# Patient Record
Sex: Female | Born: 1935 | Race: White | Hispanic: No | Marital: Married | Smoking: Never smoker
Health system: Southern US, Community
[De-identification: ages and names within clinical notes are randomized; demographics above are authoritative.]

## PROBLEM LIST (undated history)

## (undated) DIAGNOSIS — E119 Type 2 diabetes mellitus without complications: Secondary | ICD-10-CM

## (undated) DIAGNOSIS — M1990 Unspecified osteoarthritis, unspecified site: Secondary | ICD-10-CM

---

## 2014-08-14 ENCOUNTER — Ambulatory Visit (HOSPITAL_COMMUNITY)
Admission: RE | Admit: 2014-08-14 | Discharge: 2014-08-14 | Disposition: A | Discharge status: Home / Self Care | Payer: Medicare Other | Source: Ambulatory Visit | Attending: General Surgery | Admitting: General Surgery

## 2014-08-14 ENCOUNTER — Encounter (HOSPITAL_BASED_OUTPATIENT_CLINIC_OR_DEPARTMENT_OTHER): Payer: Medicare Other | Attending: General Surgery

## 2014-08-14 ENCOUNTER — Other Ambulatory Visit (HOSPITAL_BASED_OUTPATIENT_CLINIC_OR_DEPARTMENT_OTHER): Payer: Self-pay | Admitting: General Surgery

## 2014-08-14 DIAGNOSIS — E119 Type 2 diabetes mellitus without complications: Secondary | ICD-10-CM | POA: Diagnosis not present

## 2014-08-14 DIAGNOSIS — L97509 Non-pressure chronic ulcer of other part of unspecified foot with unspecified severity: Secondary | ICD-10-CM | POA: Insufficient documentation

## 2014-08-14 DIAGNOSIS — E1169 Type 2 diabetes mellitus with other specified complication: Secondary | ICD-10-CM | POA: Diagnosis not present

## 2014-08-14 DIAGNOSIS — M869 Osteomyelitis, unspecified: Secondary | ICD-10-CM

## 2014-08-15 NOTE — H&P (Signed)
[name] [name], [name] NO.:  [id number]  MEDICAL RECORD NO.:  [id number]  LOCATION:                                 FACILITY:  PHYSICIAN:  Joanne Gavel, M.D.        DATE OF BIRTH:  December 16, 1934  DATE OF ADMISSION:  08/14/2014 DATE OF DISCHARGE:  08/14/2014                             HISTORY & PHYSICAL   CHIEF COMPLAINT:  Wound, left great toe.  HISTORY OF PRESENT ILLNESS:  This 79 year old female, diabetic for many years.  Wheelchair bound because of back problems, developed a sore on the tip of her left great toe approximately 2 weeks ago.  This has not been particularly treated.  PAST MEDICAL HISTORY:  Significant for many conditions including anemia, COPD, sleep apnea, angina, congestive heart failure, diabetes, osteoarthritis, and neuropathy.  PAST SURGICAL HISTORY:  Essentially negative.  SOCIAL HISTORY:  Cigarettes, none.  Alcohol, none.  MEDICATIONS:  Lyrica, Nitro-Dur, NovoLog, Lipitor, trazodone, Zoloft, Zebeta, losartan, glipizide, iron, B12 shots, cholecalciferol, oxycodone, DuoNeb, Xanax, K-Dur, Lasix, Levemir.  ALLERGIES:  Penicillin and latex.  REVIEW OF SYSTEMS:  Essentially as above.  PHYSICAL EXAMINATION:  VITAL SIGNS:  Temperature 98.3, pulse 72 and regular, respirations 18, blood pressure 117/54.  Glucose is 166. GENERAL APPEARANCE:  Well developed, pale, somewhat obese, no distress. CHEST:  Clear. HEART:  Regular rhythm. ABDOMEN:  Not examined. EXTREMITIES:  Examination of lower extremity reveals an ABI of 1.28. Dorsalis pedis pulse is 4/4.  At the tip of the great toe, there is a 1.9 x 2.7 blister which when over excised reveals a very tiny skin wound at the tip of the great toe.  Loss of sensation is profound.  IMPRESSION:  Diabetic foot ulcer, Wagner 2.  PLAN OF TREATMENT:  We will treat with silver alginate and a wrap, changed 3 times a week.  We will see her in 7 days.     Joanne Gavel, M.D.     RA/MEDQ  D:  08/14/2014   T:  08/14/2014  Job:  [id number]

## 2014-08-21 DIAGNOSIS — L97509 Non-pressure chronic ulcer of other part of unspecified foot with unspecified severity: Secondary | ICD-10-CM | POA: Diagnosis not present

## 2014-08-21 DIAGNOSIS — E1169 Type 2 diabetes mellitus with other specified complication: Secondary | ICD-10-CM | POA: Diagnosis not present

## 2014-08-28 DIAGNOSIS — E1169 Type 2 diabetes mellitus with other specified complication: Secondary | ICD-10-CM | POA: Diagnosis not present

## 2014-08-28 DIAGNOSIS — L97509 Non-pressure chronic ulcer of other part of unspecified foot with unspecified severity: Secondary | ICD-10-CM | POA: Diagnosis not present

## 2014-08-28 LAB — GLUCOSE, CAPILLARY: GLUCOSE-CAPILLARY: 226 mg/dL — AB (ref 70–99)

## 2014-09-22 ENCOUNTER — Emergency Department (HOSPITAL_COMMUNITY)
Admission: EM | Admit: 2014-09-22 | Discharge: 2014-09-23 | Disposition: A | Discharge status: Home / Self Care | Payer: Medicare Other | Attending: Emergency Medicine | Admitting: Emergency Medicine

## 2014-09-22 DIAGNOSIS — Z91040 Latex allergy status: Secondary | ICD-10-CM | POA: Diagnosis not present

## 2014-09-22 DIAGNOSIS — R0602 Shortness of breath: Secondary | ICD-10-CM | POA: Diagnosis not present

## 2014-09-22 DIAGNOSIS — R4182 Altered mental status, unspecified: Secondary | ICD-10-CM | POA: Diagnosis present

## 2014-09-22 DIAGNOSIS — Z880 Allergy status to penicillin: Secondary | ICD-10-CM | POA: Insufficient documentation

## 2014-09-22 DIAGNOSIS — R5383 Other fatigue: Secondary | ICD-10-CM | POA: Insufficient documentation

## 2014-09-22 DIAGNOSIS — M6281 Muscle weakness (generalized): Secondary | ICD-10-CM | POA: Diagnosis not present

## 2014-09-22 DIAGNOSIS — R531 Weakness: Secondary | ICD-10-CM

## 2014-09-22 DIAGNOSIS — Z7982 Long term (current) use of aspirin: Secondary | ICD-10-CM | POA: Diagnosis not present

## 2014-09-22 DIAGNOSIS — Z79899 Other long term (current) drug therapy: Secondary | ICD-10-CM | POA: Insufficient documentation

## 2014-09-22 DIAGNOSIS — R413 Other amnesia: Secondary | ICD-10-CM | POA: Insufficient documentation

## 2014-09-22 HISTORY — DX: Unspecified osteoarthritis, unspecified site: M19.90

## 2014-09-22 HISTORY — DX: Type 2 diabetes mellitus without complications: E11.9

## 2014-09-22 LAB — CBC WITH DIFFERENTIAL/PLATELET
Basophils Absolute: 0 10*3/uL (ref 0.0–0.1)
Basophils Relative: 0 % (ref 0–1)
Eosinophils Absolute: 0.1 10*3/uL (ref 0.0–0.7)
Eosinophils Relative: 1 % (ref 0–5)
HCT: 32.4 % — ABNORMAL LOW (ref 36.0–46.0)
Hemoglobin: 11.4 g/dL — ABNORMAL LOW (ref 12.0–15.0)
LYMPHS ABS: 0.8 10*3/uL (ref 0.7–4.0)
Lymphocytes Relative: 6 % — ABNORMAL LOW (ref 12–46)
MCH: 31.1 pg (ref 26.0–34.0)
MCHC: 35.2 g/dL (ref 30.0–36.0)
MCV: 88.3 fL (ref 78.0–100.0)
Monocytes Absolute: 0.8 10*3/uL (ref 0.1–1.0)
Monocytes Relative: 6 % (ref 3–12)
NEUTROS PCT: 87 % — AB (ref 43–77)
Neutro Abs: 11.9 10*3/uL — ABNORMAL HIGH (ref 1.7–7.7)
PLATELETS: 227 10*3/uL (ref 150–400)
RBC: 3.67 MIL/uL — AB (ref 3.87–5.11)
RDW: 14.6 % (ref 11.5–15.5)
WBC: 13.6 10*3/uL — ABNORMAL HIGH (ref 4.0–10.5)

## 2014-09-22 NOTE — ED Provider Notes (Signed)
CSN: [id number]     Arrival date & time 09/22/14  2312 History   First MD Initiated Contact with Patient 09/22/14 2318     Chief Complaint  Patient presents with  . Altered Mental Status     (Consider location/radiation/quality/duration/timing/severity/associated sxs/prior Treatment) HPI Comments: 79 year old female with COPD, CHF, home oxygen 3 L, nursing home, obesity presents with mild mental status changes throughout the day and general fatigue. Patient uses wheelchair at baseline however was able to stand with assistance and today unable to stand with assistance due to general weakness and fatigue. Patient denies any other focal deficits are other concerns. Patient says she is feels generally weak. Difficult history and exam as patient is fatigued parents and will answer questions randomly.  Patient is a 79 y.o. female presenting with altered mental status. The history is provided by the patient, the nursing home and the EMS personnel.  Altered Mental Status Associated symptoms: weakness (gen)   Associated symptoms: no abdominal pain, no fever, no headaches, no light-headedness, no rash and no vomiting     No past medical history on file. No past surgical history on file. No family history on file. History  Substance Use Topics  . Smoking status: Not on file  . Smokeless tobacco: Not on file  . Alcohol Use: Not on file   OB History   No data available     Review of Systems  Constitutional: Positive for fatigue. Negative for fever and chills.  HENT: Negative for congestion.   Eyes: Negative for visual disturbance.  Respiratory: Positive for shortness of breath (chronic).   Cardiovascular: Negative for chest pain.  Gastrointestinal: Negative for vomiting and abdominal pain.  Genitourinary: Negative for dysuria and flank pain.  Musculoskeletal: Negative for back pain, neck pain and neck stiffness.  Skin: Negative for rash.  Neurological: Positive for weakness (gen).  Negative for light-headedness and headaches.      Allergies  Latex and Penicillins  Home Medications   Prior to Admission medications   Medication Sig Start Date End Date Taking? Authorizing Provider  acetaminophen (TYLENOL) 325 MG tablet Take 650 mg by mouth 2 (two) times daily.   Yes Historical Provider, MD  ALPRAZolam (XANAX) 0.25 MG tablet Take 0.25 mg by mouth every 8 (eight) hours as needed for anxiety.   Yes Historical Provider, MD  aspirin EC 81 MG tablet Take 81 mg by mouth daily.   Yes Historical Provider, MD  atorvastatin (LIPITOR) 10 MG tablet Take 10 mg by mouth daily.   Yes Historical Provider, MD  bisoprolol (ZEBETA) 10 MG tablet Take 10 mg by mouth daily.   Yes Historical Provider, MD  furosemide (LASIX) 80 MG tablet Take 80 mg by mouth daily.   Yes Historical Provider, MD  glipiZIDE (GLUCOTROL XL) 10 MG 24 hr tablet Take 10 mg by mouth daily with breakfast.   Yes Historical Provider, MD  insulin detemir (LEVEMIR) 100 UNIT/ML injection Inject 14-24 Units into the skin 2 (two) times daily. Use 14 units every morning and use 24 units at bedtime   Yes Historical Provider, MD  ipratropium-albuterol (DUONEB) 0.5-2.5 (3) MG/3ML SOLN Take 3 mLs by nebulization every 6 (six) hours as needed (for breathing).   Yes Historical Provider, MD  iron polysaccharides (NIFEREX) 150 MG capsule Take 150 mg by mouth daily.   Yes Historical Provider, MD  losartan (COZAAR) 25 MG tablet Take 25 mg by mouth daily.   Yes Historical Provider, MD  Menthol-Camphor (TIGER BALM ARTHRITIS RUB) 11-11 %  CREA Apply topically 3 (three) times daily as needed (for pain).   Yes Historical Provider, MD  morphine (MS CONTIN) 15 MG 12 hr tablet Take 15 mg by mouth every 12 (twelve) hours.   Yes Historical Provider, MD  morphine (MS CONTIN) 30 MG 12 hr tablet Take 30 mg by mouth See admin instructions. Daily at 2 pm   Yes Historical Provider, MD  Multiple Vitamin (MULTIVITAMIN WITH MINERALS) TABS tablet Take 1 tablet  by mouth daily.   Yes Historical Provider, MD  nitroGLYCERIN (NITRODUR - DOSED IN MG/24 HR) 0.1 mg/hr patch Place 0.1 mg onto the skin daily.   Yes Historical Provider, MD  nystatin-triamcinolone (MYCOLOG II) cream Apply 1 application topically 2 (two) times daily as needed (for rash).   Yes Historical Provider, MD  oxyCODONE (OXY IR/ROXICODONE) 5 MG immediate release tablet Take 5 mg by mouth every 4 (four) hours as needed for severe pain.   Yes Historical Provider, MD  potassium chloride SA (K-DUR,KLOR-CON) 20 MEQ tablet Take 20 mEq by mouth 2 (two) times daily.   Yes Historical Provider, MD  pregabalin (LYRICA) 300 MG capsule Take 300 mg by mouth 2 (two) times daily.   Yes Historical Provider, MD  senna-docusate (SENOKOT-S) 8.6-50 MG per tablet Take 1 tablet by mouth daily.   Yes Historical Provider, MD  sertraline (ZOLOFT) 100 MG tablet Take 150 mg by mouth at bedtime.   Yes Historical Provider, MD  traZODone (DESYREL) 50 MG tablet Take 25 mg by mouth at bedtime.   Yes Historical Provider, MD  vitamin B-12 (CYANOCOBALAMIN) 500 MCG tablet Take 500 mcg by mouth daily.   Yes Historical Provider, MD  Vitamin D, Ergocalciferol, (DRISDOL) 50000 UNITS CAPS capsule Take 50,000 Units by mouth every 30 (thirty) days.   Yes Historical Provider, MD   BP 113/59  Pulse 74  Resp 17  Ht 5\' 5"  (1.651 m)  Wt 280 lb (127.007 kg)  BMI 46.59 kg/m2  SpO2 96% Physical Exam  Nursing note and vitals reviewed. Constitutional: She appears well-developed and well-nourished.  HENT:  Head: Normocephalic and atraumatic.  Eyes: Conjunctivae are normal. Right eye exhibits no discharge. Left eye exhibits no discharge.  Neck: Normal range of motion. Neck supple. No tracheal deviation present.  Cardiovascular: Normal rate and regular rhythm.   Pulmonary/Chest: Effort normal and breath sounds normal.  Abdominal: Soft. She exhibits no distension. There is no tenderness (obesity). There is no guarding.  Musculoskeletal:  She exhibits no edema.  Neurological: She is alert. GCS eye subscore is 3. GCS verbal subscore is 4. GCS motor subscore is 6.  General fatigue appearance mild lethargy. With loud verbal patient does answer most questions. Patient has 4+ weakness bilateral upper and lower extremity, gross sensation intact bilateral. Pupils equal, extra on the muscle function intact, neck supple no meningismus, general slowing response.  Skin: Skin is warm. No rash noted.  Psychiatric: She has a normal mood and affect.    ED Course  Procedures (including critical care time) Labs Review Labs Reviewed  BASIC METABOLIC PANEL - Abnormal; Notable for the following:    Sodium 134 (*)    Chloride 95 (*)    Glucose, Bld 258 (*)    BUN 40 (*)    Creatinine, Ser 1.79 (*)    GFR calc non Af Amer 26 (*)    GFR calc Af Amer 30 (*)    Anion gap 16 (*)    All other components within normal limits  CBC WITH DIFFERENTIAL -  Abnormal; Notable for the following:    WBC 13.6 (*)    RBC 3.67 (*)    Hemoglobin 11.4 (*)    HCT 32.4 (*)    Neutrophils Relative % 87 (*)    Neutro Abs 11.9 (*)    Lymphocytes Relative 6 (*)    All other components within normal limits  I-STAT ARTERIAL BLOOD GAS, ED - Abnormal; Notable for the following:    pO2, Arterial 103.0 (*)    Bicarbonate 25.0 (*)    All other components within normal limits  URINE CULTURE  TROPONIN I  URINALYSIS, ROUTINE W REFLEX MICROSCOPIC  PRO B NATRIURETIC PEPTIDE  BLOOD GAS, ARTERIAL    Imaging Review Dg Chest 2 View  09/23/2014   CLINICAL DATA:  Shortness of breath. History of COPD, diabetes, hypertension.  EXAM: CHEST  2 VIEW  COMPARISON:  05/02/2013  FINDINGS: Cardiomegaly. No confluent opacities or effusions. Mild vascular congestion. No edema. Trace effusions noted posteriorly on the lateral view.  IMPRESSION: Cardiomegaly with vascular congestion and trace effusions.   Electronically Signed   By: Charlett NoseKevin  Dover M.D.   On: 09/23/2014 01:03   Ct Head Wo  Contrast  09/23/2014   CLINICAL DATA:  Initial evaluation for altered mental status. Acute memory changes.  EXAM: CT HEAD WITHOUT CONTRAST  TECHNIQUE: Contiguous axial images were obtained from the base of the skull through the vertex without intravenous contrast.  COMPARISON:  Prior study from 05/01/2009  FINDINGS: Diffuse prominence of the CSF containing spaces is compatible with generalized cerebral atrophy. Patchy hypodensity within the periventricular and deep white matter most consistent with chronic small vessel ischemic changes.  There is no acute intracranial hemorrhage or infarct. No mass lesion or midline shift. Gray-white matter differentiation is well maintained. Ventricles are normal in size without evidence of hydrocephalus. No extra-axial fluid collection.  The calvarium is intact.  Orbital soft tissues are within normal limits.  The paranasal sinuses are clear. Scant opacity present within the inferior mastoid air cells bilaterally.  Scalp soft tissues are unremarkable.  IMPRESSION: 1. No acute intracranial process. 2. Generalized cerebral atrophy with moderate chronic microvascular ischemic disease.   Electronically Signed   By: Rise MuBenjamin  McClintock M.D.   On: 09/23/2014 01:26     EKG Interpretation None     EKG reviewed heart rate 88, sinus, nonspecific T-wave flattening, normal QT  MDM   Final diagnoses:  SOB (shortness of breath)  Memory change  General weakness  ARF  Patient with mild memory changes throughout the day and general weakness and fatigue. Broad differential with patient's medical history. Plan for metabolic, cardiac, ABG to check CO2 levels, urinalysis and chest x-ray. CT head. No witnessed falls. CT head and cxr no acute findings, reviewed.  Pt improved significantly in ED, on recheck at baseline sitting up talking, smiling, family agrees she is at baseline. Pt prefers outpt fup, no acute findings in work up, pt comfortable with outpt fup. No indication for  admission at this time. Results and differential diagnosis were discussed with the patient/parent/guardian. Close follow up outpatient was discussed, comfortable with the plan.   Medications - No data to display  Filed Vitals:   09/23/14 0200 09/23/14 0230 09/23/14 0300 09/23/14 0351  BP: 102/50 124/74 122/52 113/59  Pulse: 72 75 72 74  Resp: 17 16 14 17   Height:      Weight:      SpO2: 96% 97% 96% 96%    Final diagnoses:  SOB (shortness of breath)  Memory change  General weakness  ARF    Enid SkeensJoshua M Tawania Daponte, MD 09/23/14 [id number]

## 2014-09-22 NOTE — ED Notes (Signed)
From [facility], altered per staff, pt denies pain or complaints, acute memory changes per staff, CBG 277, VSS, 84% on RA, 94% on 4L, no focal neuro deficits, unable to stand without assistance,

## 2014-09-23 ENCOUNTER — Emergency Department (HOSPITAL_COMMUNITY): Payer: Medicare Other

## 2014-09-23 ENCOUNTER — Encounter (HOSPITAL_COMMUNITY): Payer: Self-pay | Admitting: Emergency Medicine

## 2014-09-23 DIAGNOSIS — R0602 Shortness of breath: Secondary | ICD-10-CM | POA: Diagnosis not present

## 2014-09-23 LAB — PRO B NATRIURETIC PEPTIDE: PRO B NATRI PEPTIDE: 375.1 pg/mL (ref 0–450)

## 2014-09-23 LAB — URINALYSIS, ROUTINE W REFLEX MICROSCOPIC
Bilirubin Urine: NEGATIVE
Glucose, UA: NEGATIVE mg/dL
Hgb urine dipstick: NEGATIVE
Ketones, ur: NEGATIVE mg/dL
LEUKOCYTES UA: NEGATIVE
Nitrite: NEGATIVE
Protein, ur: NEGATIVE mg/dL
SPECIFIC GRAVITY, URINE: 1.012 (ref 1.005–1.030)
UROBILINOGEN UA: 0.2 mg/dL (ref 0.0–1.0)
pH: 5 (ref 5.0–8.0)

## 2014-09-23 LAB — BASIC METABOLIC PANEL
ANION GAP: 16 — AB (ref 5–15)
BUN: 40 mg/dL — ABNORMAL HIGH (ref 6–23)
CO2: 23 mEq/L (ref 19–32)
Calcium: 9.5 mg/dL (ref 8.4–10.5)
Chloride: 95 mEq/L — ABNORMAL LOW (ref 96–112)
Creatinine, Ser: 1.79 mg/dL — ABNORMAL HIGH (ref 0.50–1.10)
GFR calc non Af Amer: 26 mL/min — ABNORMAL LOW (ref >90)
GFR, EST AFRICAN AMERICAN: 30 mL/min — AB (ref >90)
Glucose, Bld: 258 mg/dL — ABNORMAL HIGH (ref 70–99)
POTASSIUM: 5.1 meq/L (ref 3.7–5.3)
SODIUM: 134 meq/L — AB (ref 137–147)

## 2014-09-23 LAB — I-STAT ARTERIAL BLOOD GAS, ED
ACID-BASE EXCESS: 1 mmol/L (ref 0.0–2.0)
BICARBONATE: 25 meq/L — AB (ref 20.0–24.0)
O2 Saturation: 98 %
PCO2 ART: 38 mmHg (ref 35.0–45.0)
Patient temperature: 98.6
TCO2: 26 mmol/L (ref 0–100)
pH, Arterial: 7.427 (ref 7.350–7.450)
pO2, Arterial: 103 mmHg — ABNORMAL HIGH (ref 80.0–100.0)

## 2014-09-23 LAB — TROPONIN I: Troponin I: <0.3 ng/mL (ref <0.30)

## 2014-09-23 NOTE — ED Notes (Signed)
Patient transported to X-ray 

## 2014-09-23 NOTE — Discharge Instructions (Signed)
If you were given medicines take as directed.  If you are on coumadin or contraceptives realize their levels and effectiveness is altered by many different medicines.  If you have any reaction (rash, tongues swelling, other) to the medicines stop taking and see a physician.   Please follow up as directed and return to the ER or see a physician for new or worsening symptoms.  Thank you. Filed Vitals:   09/23/14 0200 09/23/14 0230 09/23/14 0300 09/23/14 0351  BP: 102/50 124/74 122/52 113/59  Pulse: 72 75 72 74  Resp: 17 16 14 17   Height:      Weight:      SpO2: 96% 97% 96% 96%

## 2014-09-23 NOTE — ED Notes (Signed)
[facility] contacted to tx patient back to [facility]

## 2014-09-23 NOTE — ED Notes (Signed)
Patient displaced IV from left forearm. Bleeding controlled with direct pressure and has stopped.

## 2014-09-24 LAB — URINE CULTURE
CULTURE: NO GROWTH
Colony Count: NO GROWTH

## 2014-12-10 ENCOUNTER — Encounter (HOSPITAL_BASED_OUTPATIENT_CLINIC_OR_DEPARTMENT_OTHER): Payer: Medicare Other | Attending: Plastic Surgery

## 2014-12-10 DIAGNOSIS — G4733 Obstructive sleep apnea (adult) (pediatric): Secondary | ICD-10-CM | POA: Diagnosis not present

## 2014-12-10 DIAGNOSIS — E785 Hyperlipidemia, unspecified: Secondary | ICD-10-CM | POA: Diagnosis not present

## 2014-12-10 DIAGNOSIS — L97429 Non-pressure chronic ulcer of left heel and midfoot with unspecified severity: Secondary | ICD-10-CM | POA: Insufficient documentation

## 2014-12-10 DIAGNOSIS — Z7951 Long term (current) use of inhaled steroids: Secondary | ICD-10-CM | POA: Diagnosis not present

## 2014-12-10 DIAGNOSIS — E11621 Type 2 diabetes mellitus with foot ulcer: Secondary | ICD-10-CM | POA: Diagnosis present

## 2014-12-10 DIAGNOSIS — Z7982 Long term (current) use of aspirin: Secondary | ICD-10-CM | POA: Diagnosis not present

## 2014-12-10 DIAGNOSIS — E669 Obesity, unspecified: Secondary | ICD-10-CM | POA: Diagnosis not present

## 2014-12-10 DIAGNOSIS — Z79891 Long term (current) use of opiate analgesic: Secondary | ICD-10-CM | POA: Diagnosis not present

## 2014-12-10 DIAGNOSIS — I509 Heart failure, unspecified: Secondary | ICD-10-CM | POA: Diagnosis not present

## 2014-12-10 DIAGNOSIS — G629 Polyneuropathy, unspecified: Secondary | ICD-10-CM | POA: Diagnosis not present

## 2014-12-10 DIAGNOSIS — J449 Chronic obstructive pulmonary disease, unspecified: Secondary | ICD-10-CM | POA: Insufficient documentation

## 2014-12-10 DIAGNOSIS — I10 Essential (primary) hypertension: Secondary | ICD-10-CM | POA: Insufficient documentation

## 2014-12-11 NOTE — Consult Note (Signed)
NAMJene Every:  [name], [name]                ACCOUNT NO.:  [id number]  MEDICAL RECORD NO.:  [id number]  LOCATION:  FOOT                         FACILITY:  [facility]  PHYSICIAN:  Glenna FellowsBrinda Sharri Loya, MD   DATE OF BIRTH:  11-22-1935  DATE OF CONSULTATION:  12/10/2014 DATE OF DISCHARGE:                                CONSULTATION   CHIEF COMPLAINT:  Left heel ulceration.  HISTORY OF PRESENT ILLNESS:  The patient is a 80 year old, minimally ambulatory female with diabetes mellitus who presents for waxing and waning history for the last 3 to 4 months of left heel ulceration. Patient is largely wheelchair bound secondary to back problems.  She states that the ulceration over her heel comes and goes and is associated with her footwear.  She has what appears to be soft, offloading boots in her nursing facility.  She states they are quite old by several years.  [facility] the fall of 2015 for left great toe ulceration that went on to heal.  Review of her workup at that time revealed plain films.  No vascular workup was completed.  Patient states that her blood sugars are anywhere from the 100s to 200s.  She does not recall a recent hemoglobin A1c.  There are no recent laboratories available for review.  PAST MEDICAL HISTORY:  Includes diabetes mellitus, obesity, arthritis, hypertension, and hyperlipidemia. COPD, obstructive sleep apnea, angina, congestive heart failure, osteoarthritis, and peripheral neuropathy.  ALLERGIES:  Include LATEX and PENICILLIN.  MEDICATIONS:  Include Xanax with Lipitor, aspirin, vitamin B12, Lasix, Glucotrol, Levemir, DuoNeb, Cozaar, oral morphine, Nitro-Dur, oxycodone, Niferex, Mycolog cream, Zebeta, Lyrica, Senokot, Zoloft, Desyrel, and potassium chloride.  PAST SURGICAL HISTORY:  Includes cataracts and left ankle surgery and pinning.  SOCIAL HISTORY:  The patient is a nonsmoker.  PHYSICAL EXAMINATION:  VITAL SIGNS:  Blood pressure is  106/49, blood glucose is 200, pulse is 66, temperature is 97.9, height is 5 feet 3-1/2 inches, weight is 260 pounds. She has absent sensation over all of the toes and metatarsals over her left foot.  She does have positive sensation over heel midfoot in dorsum of her toe.  She has a palpable dorsalis pedis.  Posterior tibialis is not palpable.  ABI is calculated as 1.28.  Examination of left heel reveals a Wagner 2 ulceration measured as a cluster at 1.8 x 4 x 0.1 cm.  Left calf circumference is 45 cm, left ankle is 27.1 cm.  No debridement is performed today.  We will institute collagen dressings with heel foam protection.  Order was also placed for the nursing facility to provide a Podus boot while sleeping.  New x-rays of her left foot were ordered as well as referral to the vascular and vein specialist for ABI and TBI screening.  There are no recent laboratories to review and laboratories including hemoglobin A1c, prealbumin, CBC, and BMP were ordered.  We will plan for followup in 2 weeks' time.          ______________________________ Glenna FellowsBrinda Giulietta Prokop, MD MBA     BT/MEDQ  D:  12/10/2014  T:  12/11/2014  Job:  [id number]

## 2014-12-21 ENCOUNTER — Encounter (HOSPITAL_COMMUNITY): Payer: 59

## 2015-03-20 IMAGING — CR DG CHEST 2V
2 series · 2 of 2 positions shown · non-contrast
Comparison: 05/02/2013

CLINICAL DATA: Shortness of breath. History of COPD, diabetes,
hypertension.

EXAM:
CHEST  2 VIEW

[x chest ap]
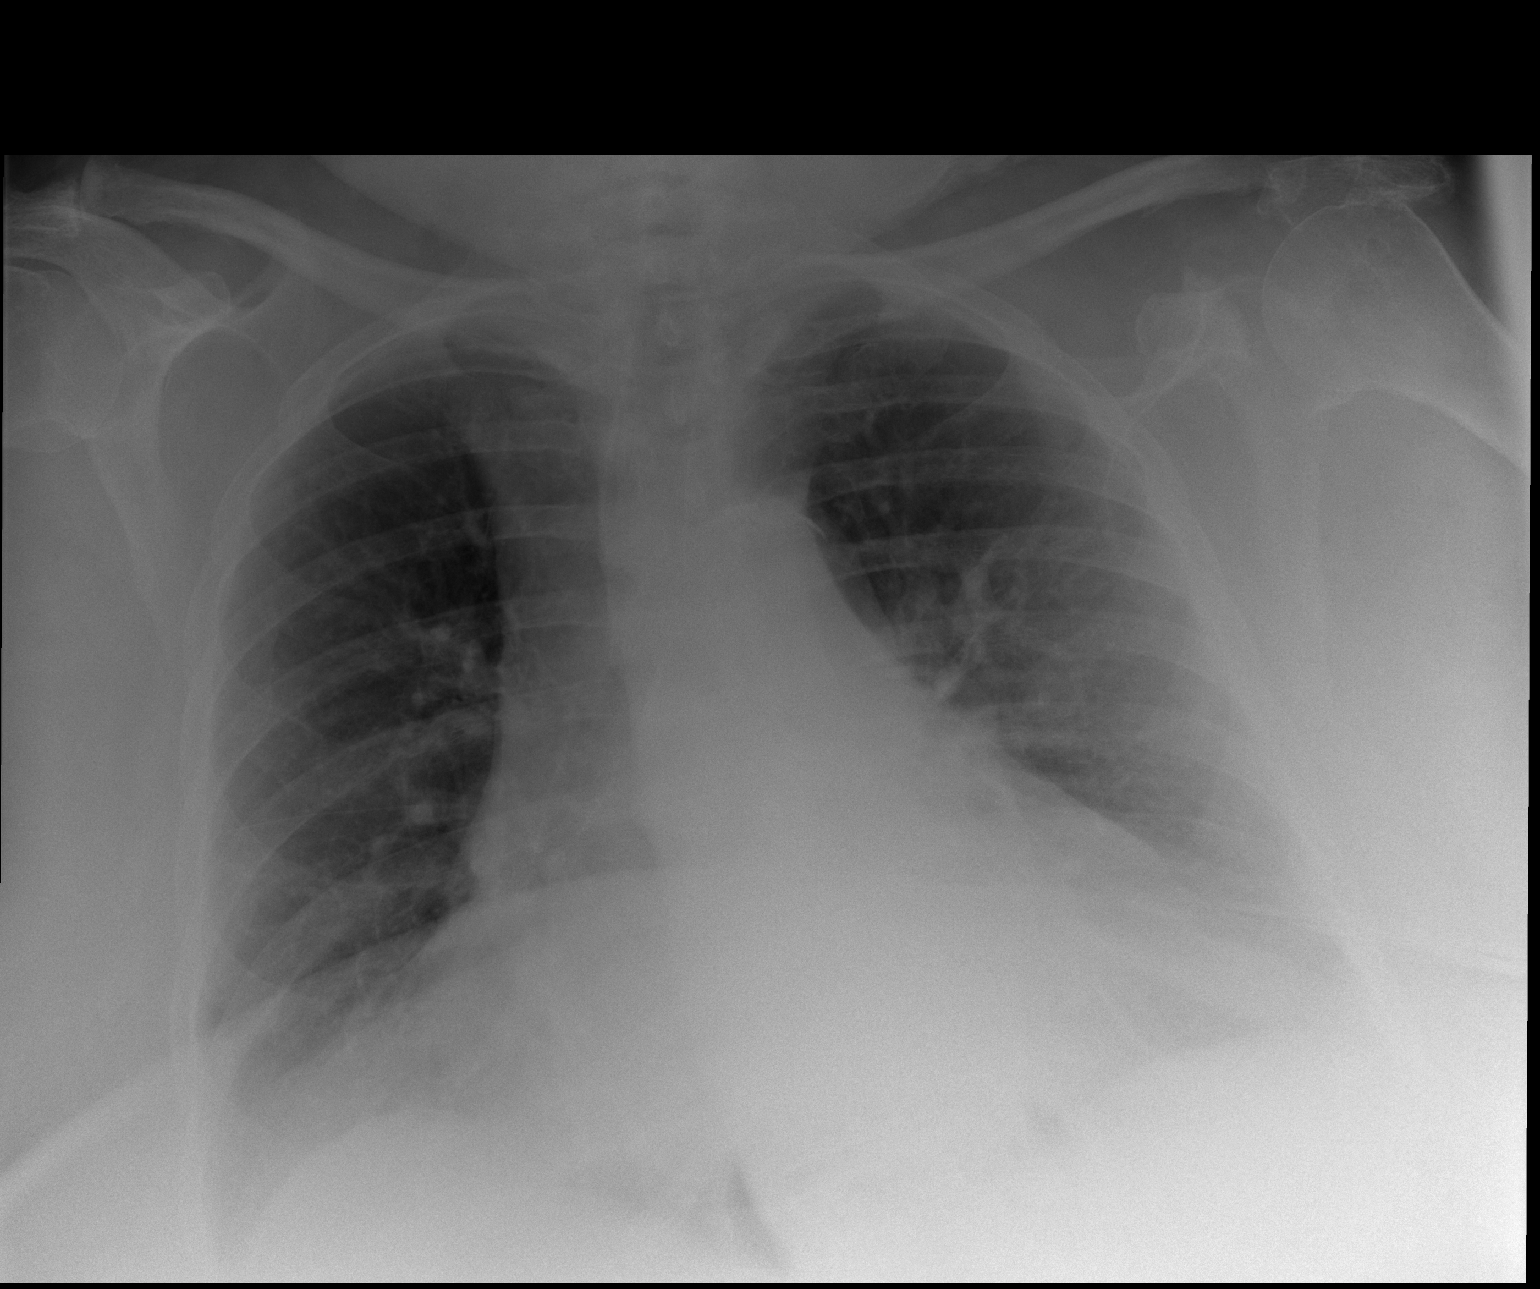

[w chest lat]
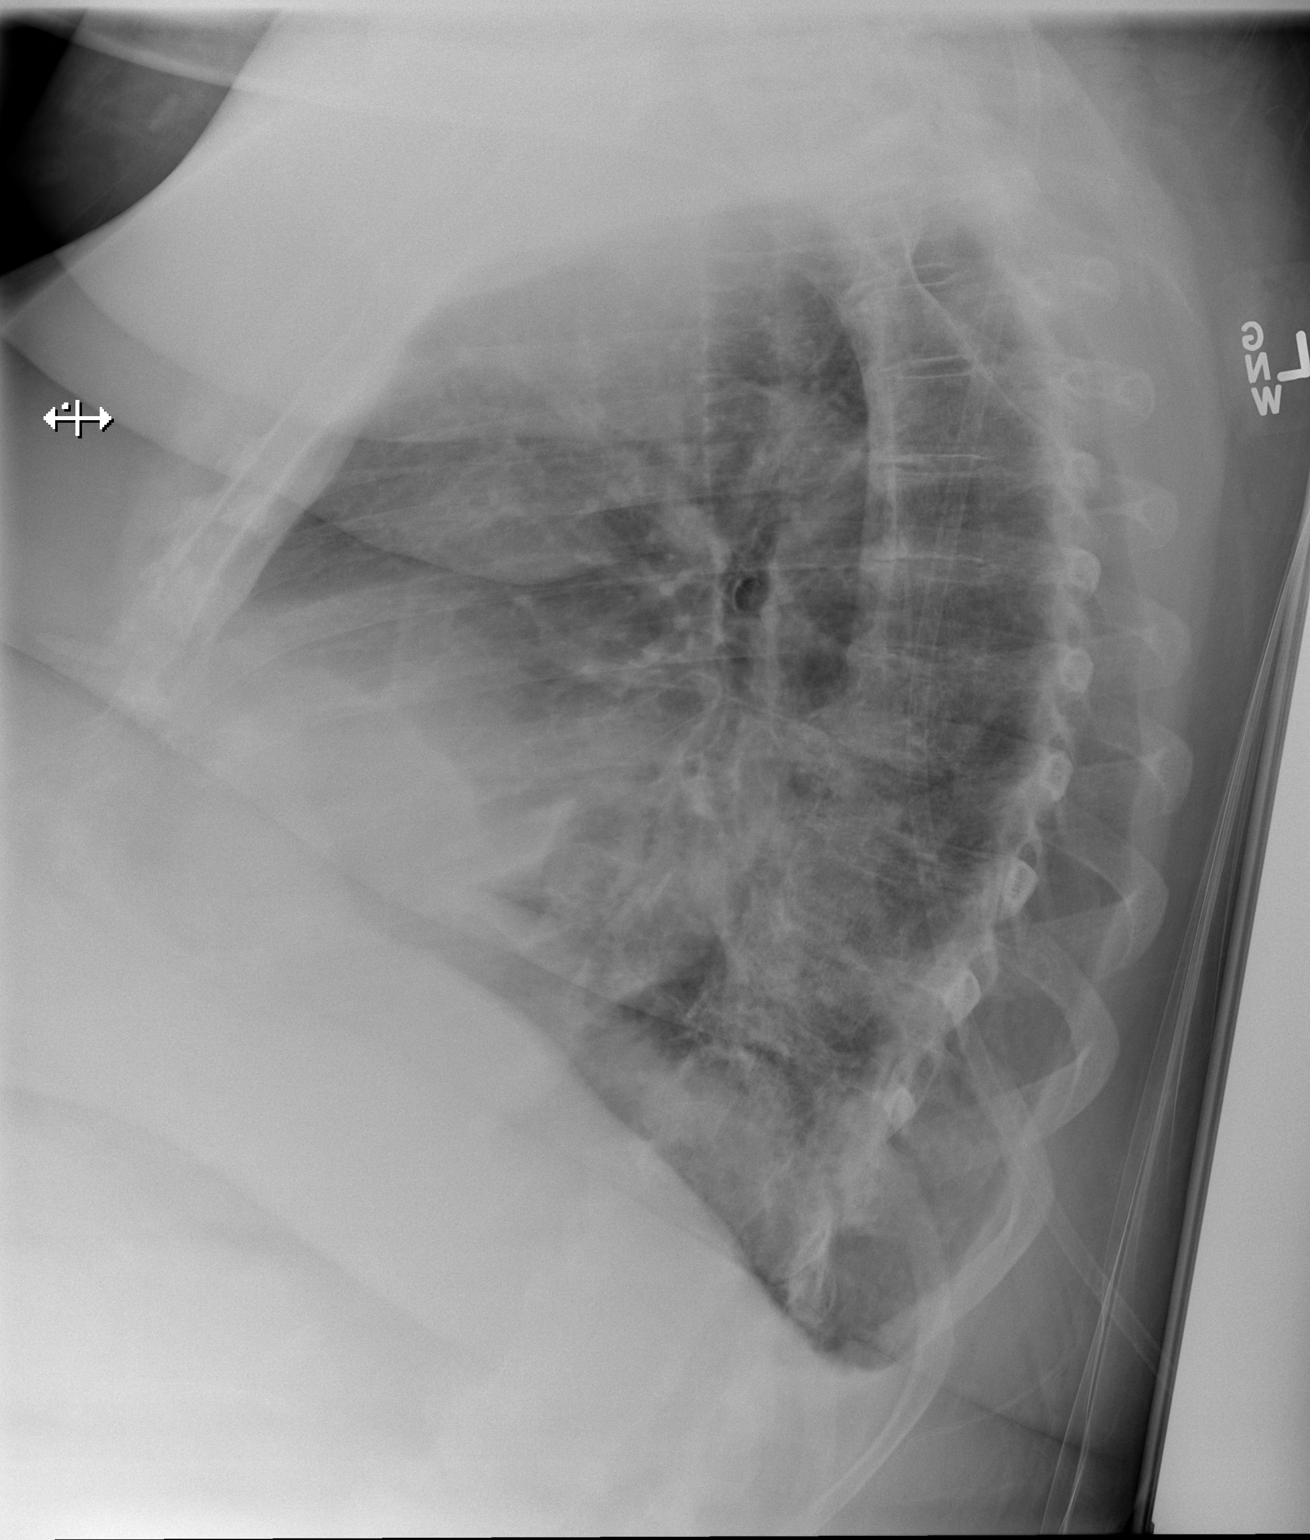

[2 of 2 positions shown; findings below may reference images not displayed]

FINDINGS: Cardiomegaly. No confluent opacities or effusions. Mild vascular
congestion. No edema. Trace effusions noted posteriorly on the
lateral view.
IMPRESSION: Cardiomegaly with vascular congestion and trace effusions.

## 2015-03-20 IMAGING — CT CT HEAD W/O CM
1 of 2 series · 15 of 30 positions shown, 19 images · non-contrast
Comparison: Prior study from 05/01/2009

CLINICAL DATA: Initial evaluation for altered mental status. Acute
memory changes.

EXAM:
CT HEAD WITHOUT CONTRAST
TECHNIQUE: Contiguous axial images were obtained from the base of the skull
through the vertex without intravenous contrast.

[Series 3: head 2.0 h70h · axial · 0.41mm/px · z∈[+663,+817]mm · 15 of 87 slices shown, 19 images]
[im 5/87  brain]
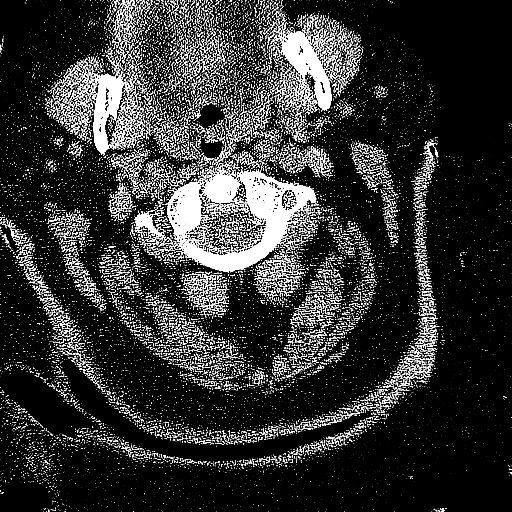
[im 5/87  bone]
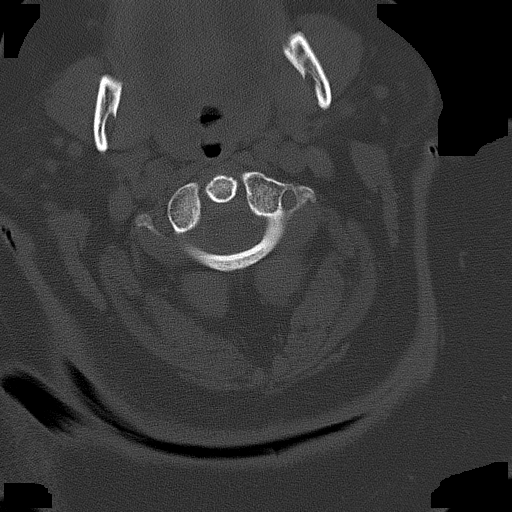
[im 9/87  brain]
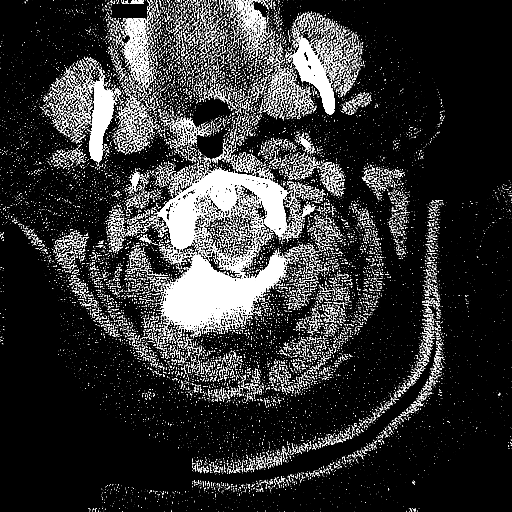
[im 18/87  brain]
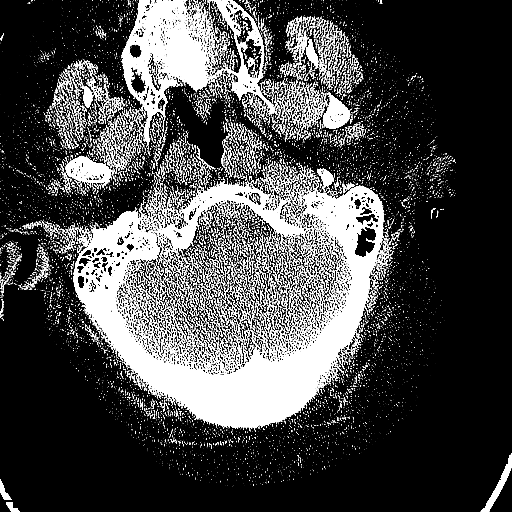
[im 22/87  brain]
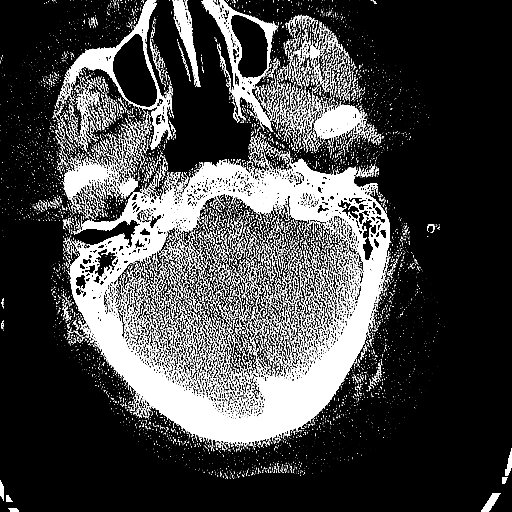
[im 26/87  brain]
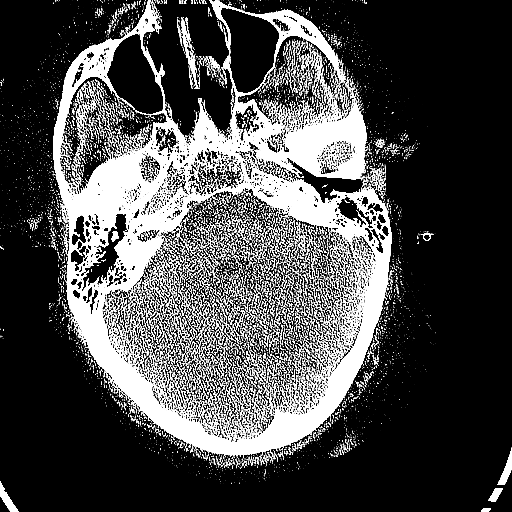
[im 26/87  bone]
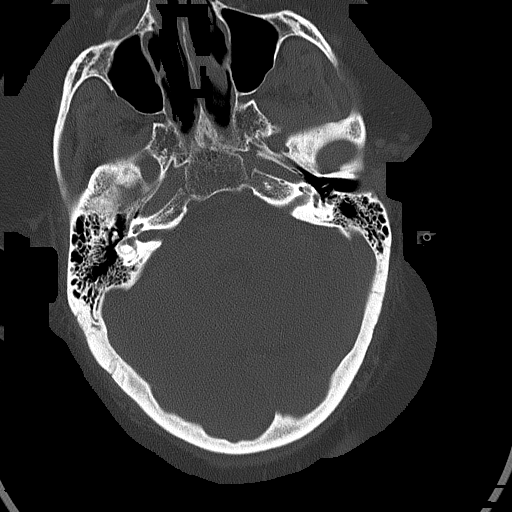
[im 31/87  brain]
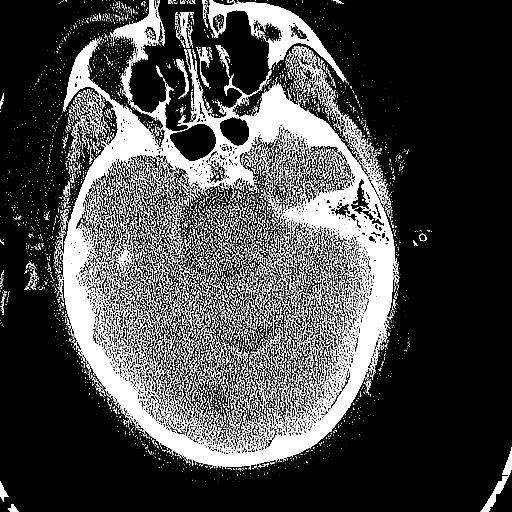
[im 39/87  brain]
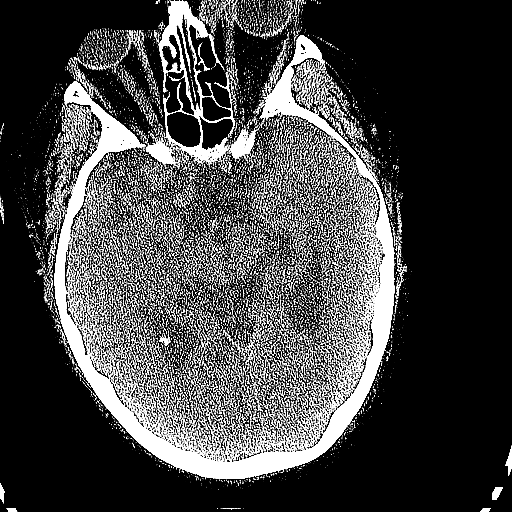
[im 44/87  brain]
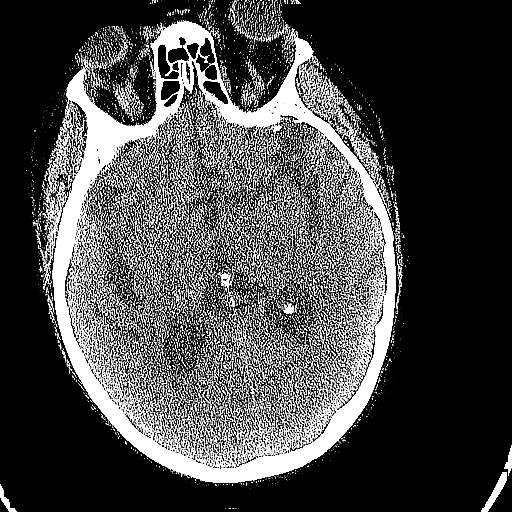
[im 48/87  brain]
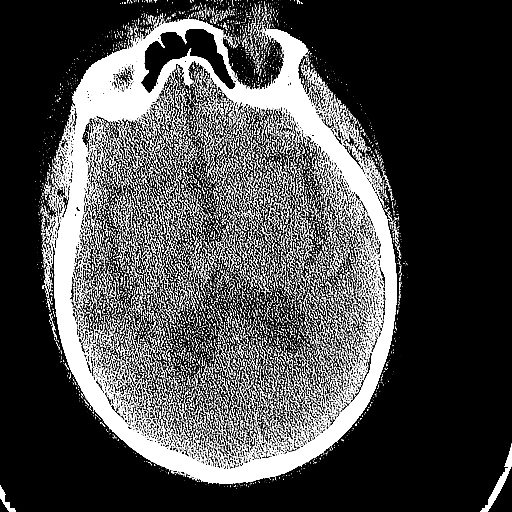
[im 48/87  bone]
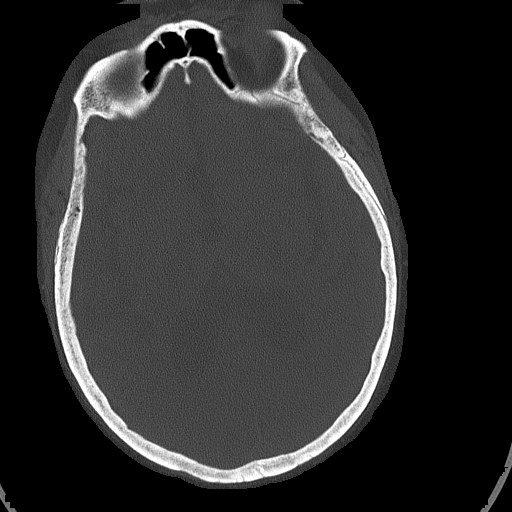
[im 56/87  brain]
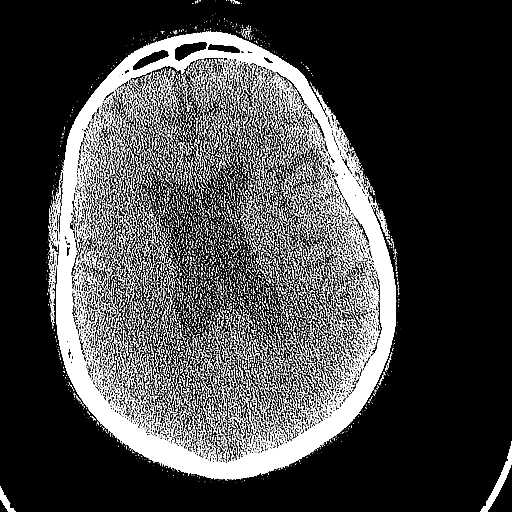
[im 61/87  brain]
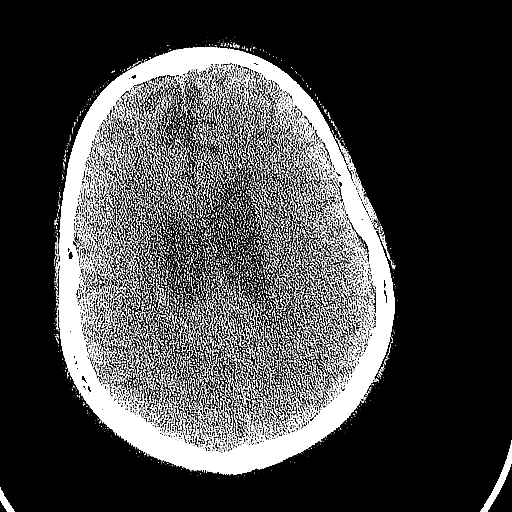
[im 65/87  brain]
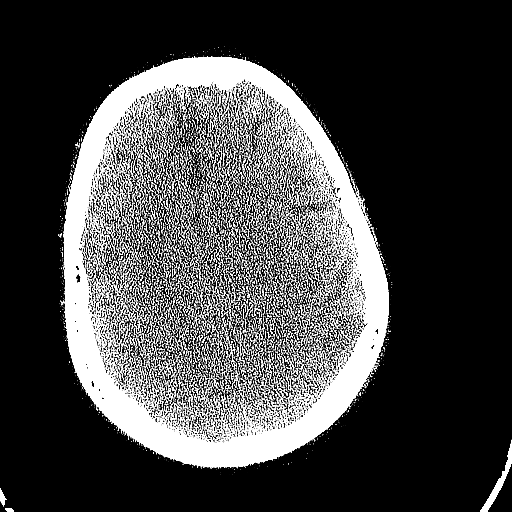
[im 69/87  brain]
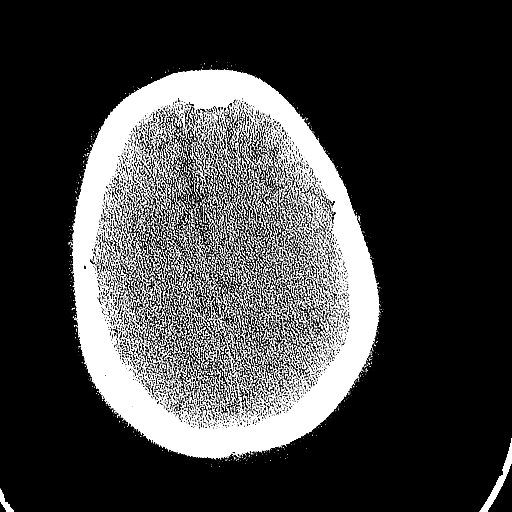
[im 69/87  bone]
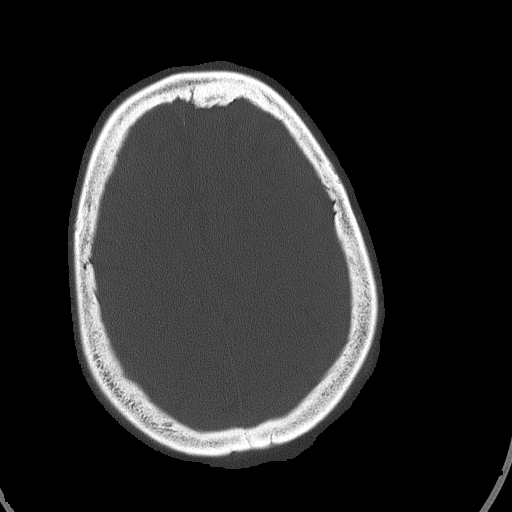
[im 78/87  brain]
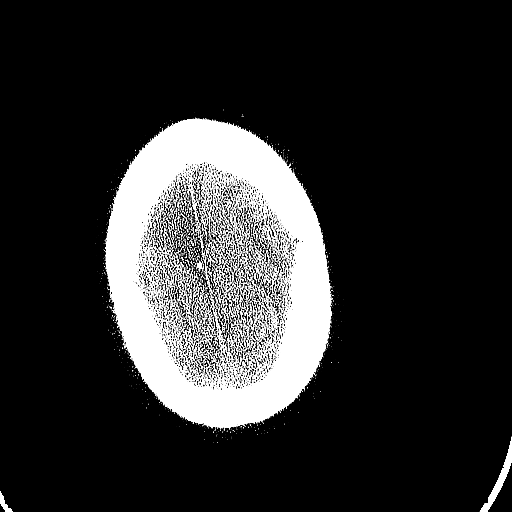
[im 82/87  brain]
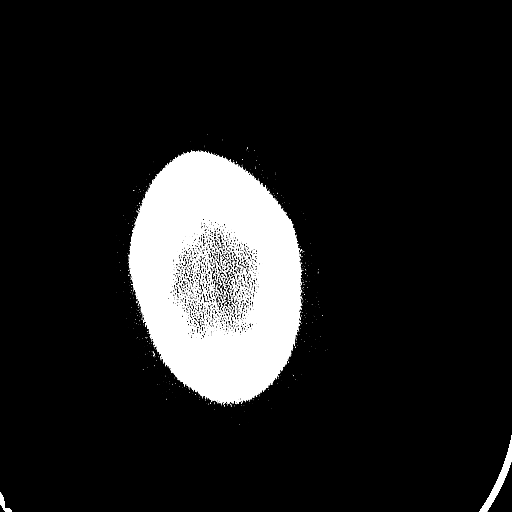

[15 of 30 positions shown; findings below may reference images not displayed]

FINDINGS: Diffuse prominence of the CSF containing spaces is compatible with
generalized cerebral atrophy. Patchy hypodensity within the
periventricular and deep white matter most consistent with chronic
small vessel ischemic changes.

There is no acute intracranial hemorrhage or infarct. No mass lesion
or midline shift. Gray-white matter differentiation is well
maintained. Ventricles are normal in size without evidence of
hydrocephalus. No extra-axial fluid collection.

The calvarium is intact.

Orbital soft tissues are within normal limits.

The paranasal sinuses are clear. Scant opacity present within the
inferior mastoid air cells bilaterally.

Scalp soft tissues are unremarkable.
IMPRESSION: 1. No acute intracranial process.
2. Generalized cerebral atrophy with moderate chronic microvascular
ischemic disease.

## 2015-07-05 ENCOUNTER — Ambulatory Visit (HOSPITAL_BASED_OUTPATIENT_CLINIC_OR_DEPARTMENT_OTHER): Payer: Medicare Other | Attending: Internal Medicine

## 2015-07-05 VITALS — Ht 64.0 in | Wt 260.0 lb

## 2015-07-05 DIAGNOSIS — R0683 Snoring: Secondary | ICD-10-CM | POA: Diagnosis not present

## 2015-07-05 DIAGNOSIS — I493 Ventricular premature depolarization: Secondary | ICD-10-CM | POA: Diagnosis not present

## 2015-07-05 DIAGNOSIS — I110 Hypertensive heart disease with heart failure: Secondary | ICD-10-CM

## 2015-07-05 DIAGNOSIS — G4733 Obstructive sleep apnea (adult) (pediatric): Secondary | ICD-10-CM | POA: Diagnosis present

## 2015-07-07 ENCOUNTER — Ambulatory Visit: Payer: Medicare Other | Admitting: Internal Medicine

## 2015-07-07 DIAGNOSIS — I110 Hypertensive heart disease with heart failure: Secondary | ICD-10-CM

## 2015-07-07 DIAGNOSIS — I509 Heart failure, unspecified: Secondary | ICD-10-CM

## 2015-07-07 NOTE — Progress Notes (Signed)
Patient Name: [name] [name], [name] [name] Date: 07/05/2015 Gender: Female D.O.B: March 05, 1935 Age (years): 80 Referring Provider: Leanna Battles Height (inches): 2 Interpreting Physician: Baird Lyons MD, ABSM Weight (lbs): 260 RPSGT: Baxter Flattery BMI: 28 MRN: [id number] Neck Size: 20.00 CLINICAL INFORMATION Sleep Study Type: NPSG  Indication for sleep study: Congestive Heart Failure, Diabetes, Fatigue, Hypertension, OSA, Snoring, Witnessed Apneas  Epworth Sleepiness Score: 11  MEDICATIONS Medications taken by the patient : Charted for review Medications administered by patient during sleep study : No sleep medicine administered.  SLEEP STUDY TECHNIQUE As per the AASM Manual for the Scoring of Sleep and Associated Events v2.3 (April 2016) with a hypopnea requiring 4% desaturations.  The channels recorded and monitored were frontal, central and occipital EEG, electrooculogram (EOG), submentalis EMG (chin), nasal and oral airflow, thoracic and abdominal wall motion, anterior tibialis EMG, snore microphone, electrocardiogram, and pulse oximetry.  RESPIRATORY PARAMETERS There were a total of 42 respiratory disturbances out of which 36 were apneas ( 36 obstructive, 0 mixed, 0 central) and 6 hypopneas. The apnea/hypopnea index (AHI) was 7.6 events/hour. The central sleep apnea index was 0.0 events/hour. The REM AHI was 4.3 events/hour and NREM AHI was 8.0 events/hour. The supine AHI was 7.6 events/hour and the non supine AHI was N/A supine during 100.00% of sleep. Respiratory disturbances were associated with oxygen desaturation down to a nadir of 92.00% during sleep. The mean oxygen saturation during the study was 97.77%. This study was performed with the patient wearing supplemental O2 at 3L/ min as at home. There were not enough early respiratory events to meet protocol requirements for split CPAP titration on this study.   SLEEP ARCHITECTURE The study was initiated at 9:57:47 PM and  terminated at 5:03:37 AM. The total recorded time was 425.8 minutes. EEG confirmed total sleep time was 332.3 minutes yielding a sleep efficiency of 78.0%. Sleep onset after lights out was 33.1 minutes with a REM latency of 251.0 minutes. The patient spent 7.83% of the night in stage N1 sleep, 79.68% in stage N2 sleep, 0.00% in stage N3 and 12.49% in REM. Wake after sleep onset (WASO) was 60.5 minutes. The Arousal Index was 4.5/hour. Split Night criteria were not met.  LEG MOVEMENT DATA The total Periodic Limb Movements of Sleep (PLMS) were 68. The PLMS index was 12.28 .  CARDIAC DATA The 2 lead EKG demonstrated sinus rhythm. Other EKG findings include: PVCs.  IMPRESSIONS Mild obstructive sleep apnea occurred during this study (AHI = 7.6/hour). There were not enough early events to permit application of split protocol CPAP titration No significant central sleep apnea occurred during this study (CAI = 0.0). The patient had minimal or no oxygen desaturation during the study (Min O2 = 92.00), recorded while wearing O2 3 L/M. The patient snored with Moderate snoring volume during the diagnostic portion of the study. EKG findings include PVCs. Mild periodic limb movements of sleep occurred during the study.  DIAGNOSIS Obstructive Sleep Apnea (327.23 [G47.33 ICD-10])  RECOMMENDATIONS Therapeutic options, based on clinical assessment, could include conservative measures only, titration for CPAP, or consideration of an oral appliance. Avoid alcohol, sedatives and other CNS depressants that may worsen sleep apnea and disrupt normal sleep architecture. Sleep hygiene should be reviewed to assess factors that may improve sleep quality. Weight management and regular exercise should be initiated or continued. Return to [facility] Center for re-evaluation.  Deneise Lever Diplomate, American Board of Sleep Medicine  ELECTRONICALLY SIGNED ON:  07/07/2015, 10:54 AM [place] PH:  [phone]   FX: [phone] ACCREDITED BY THE AMERICAN ACADEMY OF SLEEP MEDICINE

## 2016-01-01 DEATH — deceased
